# Patient Record
Sex: Male | Born: 2000 | Hispanic: Yes | Marital: Single | Smoking: Never smoker
Health system: Southern US, Community
[De-identification: ages and names within clinical notes are randomized; demographics above are authoritative.]

## PROBLEM LIST (undated history)

## (undated) DIAGNOSIS — T7840XA Allergy, unspecified, initial encounter: Secondary | ICD-10-CM

## (undated) DIAGNOSIS — J45909 Unspecified asthma, uncomplicated: Secondary | ICD-10-CM

## (undated) HISTORY — DX: Unspecified asthma, uncomplicated: J45.909

## (undated) HISTORY — DX: Allergy, unspecified, initial encounter: T78.40XA

---
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
## 2021-03-02 ENCOUNTER — Encounter (INDEPENDENT_AMBULATORY_CARE_PROVIDER_SITE_OTHER): Payer: Self-pay | Admitting: Dietician

## 2021-09-06 ENCOUNTER — Encounter (INDEPENDENT_AMBULATORY_CARE_PROVIDER_SITE_OTHER): Payer: Self-pay | Admitting: Pediatric Gastroenterology

## 2021-09-06 ENCOUNTER — Other Ambulatory Visit: Payer: Self-pay

## 2021-09-06 ENCOUNTER — Ambulatory Visit (INDEPENDENT_AMBULATORY_CARE_PROVIDER_SITE_OTHER): Payer: Medicaid Other | Admitting: Pediatric Gastroenterology

## 2021-09-06 VITALS — BP 110/70 | HR 72 | Ht 63.78 in | Wt 202.0 lb

## 2021-09-06 DIAGNOSIS — Z6833 Body mass index (BMI) 33.0-33.9, adult: Secondary | ICD-10-CM

## 2021-09-06 DIAGNOSIS — K760 Fatty (change of) liver, not elsewhere classified: Secondary | ICD-10-CM

## 2021-09-06 DIAGNOSIS — E6609 Other obesity due to excess calories: Secondary | ICD-10-CM

## 2021-09-06 NOTE — Progress Notes (Signed)
Pediatric Gastroenterology Follow Up Visit   REFERRING PROVIDER:  Hayden Pedro, MD [facility] [place],  [place] [address]   ASSESSMENT:     I had the pleasure of seeing Chad Carpenter, 20 y.o. male (DOB: 05-19-2001) who I saw in follow up today for evaluation of elevation of aminotransferases. My impression is that the most likely cause of the elevation of aminotransferases is nonalcoholic fatty liver disease (NAFLD).  Other causes of chronic hepatitis are possible but less likely.    An ultrasound that was performed 03/02/18 showed "No acute findings. No focal hepatic abnormality. Normal echotexture throughout the liver." Even though he did not have increased echotexture of the liver, NAFLD is still the most likely diagnosis.  His weight is going up and down since I first met him. I advised him to avoid sugary or sweetened beverages and to minimize ethanol consumption. I will order a repeat CMP and an alpha-fetoprotein       PLAN:       CMP and alpha-fetoprotein Results will guide next steps See back in 6 months Thank you for allowing [place] to participate in the care of your patient      HISTORY OF PRESENT ILLNESS: Chad Carpenter is a 20 y.o. male (DOB: 01-08-2001) who is seen in consultation for evaluation of elevation of aminotransferases. History was obtained from his Chad Carpenter. He has regained weight. He does not have pruritus. He has not been jaundiced. He has good energy. 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 He consumes ethanol in social events.  Past history As you know, Chad Carpenter was found to have increased aminotransferases during a routine physical examination.  He had elevation of AST and ALT, both.  His AST was 103 international units/L and his ALT was 130 international units/L.  His albumin, total bilirubin and alkaline phosphatase were all within normal limits.  He had mildly elevated total cholesterol.  He had nonfasting triglycerides  elevated at 134 mg/dL.  He does not have a family history of liver disease.  He is overweight.  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  He is not on medications that are commonly associated with hepatitis.  He has not been exposed to sick contacts.  The family is originally from [place] but he has been in the 90s states that since he was 20 years of age.  He has not traveled back to [place] since then.  Of note, his serum glucose was 63 and his hemoglobin A1c was 5.1%.  PAST MEDICAL HISTORY: Past Medical History:  Diagnosis Date   Allergy    Asthma     There is no immunization history on file for this patient. PAST SURGICAL HISTORY: History reviewed. No pertinent surgical history. SOCIAL HISTORY: Social History   Socioeconomic History   Marital status: Single    Spouse name: Not on file   Number of children: Not on file   Years of education: Not on file   Highest education level: Not on file  Occupational History   Not on file  Tobacco Use   Smoking status: Never   Smokeless tobacco: Never  Substance and Sexual Activity   Alcohol use: Not on file   Drug use: Not on file   Sexual activity: Not on file  Other Topics Concern   Not on file  Social History Narrative   Lives with mom  Step-dad and two sisters   Going to school for [employer]   Social  Determinants of Health   Financial Resource Strain: Not on file  Food Insecurity: Not on file  Transportation Needs: Not on file  Physical Activity: Not on file  Stress: Not on file  Social Connections: Not on file   FAMILY HISTORY: family history is not on file.   REVIEW OF SYSTEMS:  The balance of 12 systems reviewed is negative except as noted in the HPI.  MEDICATIONS: Current Outpatient Medications  Medication Sig Dispense Refill   cetirizine (ZYRTEC) 10 MG tablet TK 1 T PO D  3   FLOVENT HFA 110 MCG/ACT inhaler INL 2 PUFFS PO BID  3   fluticasone (FLONASE) 50 MCG/ACT nasal spray SHAKE LQ AND U 2 SPRAYS IEN D      montelukast (SINGULAIR) 10 MG tablet Take 10 mg by mouth daily.     No current facility-administered medications for this visit.   ALLERGIES: Amoxicillin  VITAL SIGNS: BP 110/70 (BP Location: Right Arm, Patient Position: Sitting)   Pulse 72   Ht 5' 3.78" (1.62 m)   Wt 202 lb (91.6 kg)   BMI 34.91 kg/m  PHYSICAL EXAM: Constitutional: Alert, no acute distress, obese, and well hydrated.  Mental Status: Pleasantly interactive, not anxious appearing. HEENT: PERRL, conjunctiva clear, anicteric, oropharynx clear, neck supple, no LAD. Respiratory: Clear to auscultation, unlabored breathing. Cardiac: Euvolemic, regular rate and rhythm, normal S1 and S2, no murmur. Abdomen: Soft, normal bowel sounds, non-distended, non-tender, no organomegaly or masses. Perianal/Rectal Exam: Not examined Extremities: No edema, well perfused. Musculoskeletal: No joint swelling or tenderness noted, no deformities. Skin: No rashes, jaundice or skin lesions noted. Neuro: No focal deficits.   DIAGNOSTIC STUDIES:  I have reviewed all pertinent diagnostic studies, including: No results found for this or any previous visit (from the past 2160 hour(s)).    Chad Carpenter A. Chad Savannah, MD Chief, Division of Pediatric Gastroenterology Professor of Pediatrics

## 2021-09-07 LAB — AFP TUMOR MARKER: AFP-Tumor Marker: 1.4 ng/mL (ref <6.1)

## 2021-09-07 LAB — COMPLETE METABOLIC PANEL WITH GFR
AG Ratio: 1.8 (calc) (ref 1.0–2.5)
ALT: 149 U/L — ABNORMAL HIGH (ref 8–46)
AST: 65 U/L — ABNORMAL HIGH (ref 12–32)
Albumin: 4.8 g/dL (ref 3.6–5.1)
Alkaline phosphatase (APISO): 141 U/L (ref 46–169)
BUN: 9 mg/dL (ref 7–20)
CO2: 28 mmol/L (ref 20–32)
Calcium: 10.2 mg/dL (ref 8.9–10.4)
Chloride: 101 mmol/L (ref 98–110)
Creat: 0.72 mg/dL (ref 0.60–1.24)
Globulin: 2.6 g/dL (calc) (ref 2.1–3.5)
Glucose, Bld: 82 mg/dL (ref 65–99)
Potassium: 4.1 mmol/L (ref 3.8–5.1)
Sodium: 139 mmol/L (ref 135–146)
Total Bilirubin: 0.8 mg/dL (ref 0.2–1.1)
Total Protein: 7.4 g/dL (ref 6.3–8.2)
eGFR: 136 mL/min/{1.73_m2} (ref >=60)

## 2022-03-07 ENCOUNTER — Ambulatory Visit (INDEPENDENT_AMBULATORY_CARE_PROVIDER_SITE_OTHER): Payer: Medicaid Other | Admitting: Pediatric Gastroenterology

## 2022-03-07 NOTE — Progress Notes (Deleted)
? ?Pediatric Gastroenterology Follow Up Visit ? ? ?REFERRING PROVIDER:  Hayden Pedro, MD ?[facility] ?[place],  [place] [address] ? ? ASSESSMENT:     ?I had the pleasure of seeing Chad Carpenter, 21 y.o. male (DOB: 2001-02-12) who I saw in follow up today for evaluation of elevation of aminotransferases. My impression is that the most likely cause of the elevation of aminotransferases is nonalcoholic fatty liver disease (NAFLD).  Other causes of chronic hepatitis are possible but less likely.   ? ?An ultrasound that was performed 03/02/18 showed "No acute findings. No focal hepatic abnormality. Normal echotexture throughout the liver." Even though he did not have increased echotexture of the liver, NAFLD is still the most likely diagnosis. ? ?His weight is going up and down since I first met him. I advised him to avoid sugary or sweetened beverages and to minimize ethanol consumption. In October 2022 his ALT had increased (see below) and an alpha-fetoprotein was normal. ? ? ?  ?  ?PLAN:       ?CMP ?Results will guide next steps ?See back in 6 months ?Thank you for allowing [place] to participate in the care of your patient ?  ? ?  ?HISTORY OF PRESENT ILLNESS: Chad Carpenter is a 21 y.o. male (DOB: June 23, 2001) who is seen in consultation for evaluation of elevation of aminotransferases. History was obtained from his Chad Carpenter. He has regained weight. He does not have pruritus. He has not been jaundiced. He has good energy. 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 ? ?Past history ?As you know, Chad Carpenter was found to have increased aminotransferases during a routine physical examination.  He had elevation of AST and ALT, both.  His AST was 103 international units/L and his ALT was 130 international units/L.  His albumin, total bilirubin and alkaline phosphatase were all within normal limits.  He had mildly elevated total cholesterol.  He had nonfasting  triglycerides elevated at 134 mg/dL.  He does not have a family history of liver disease.  He is overweight.  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  He is not on medications that are commonly associated with hepatitis.  He has not been exposed to sick contacts.  The family is originally from [place] but he has been in the 90s states that since he was 21 years of age.  He has not traveled back to [place] since then.  Of note, his serum glucose was 63 and his hemoglobin A1c was 5.1%. ? ?PAST MEDICAL HISTORY: ?Past Medical History:  ?Diagnosis Date  ? Allergy   ? Asthma   ? ? ?There is no immunization history on file for this patient. ?PAST SURGICAL HISTORY: ?No past surgical history on file. ?SOCIAL HISTORY: ?Social History  ? ?Socioeconomic History  ? Marital status: Single  ?  Spouse name: Not on file  ? Number of children: Not on file  ? Years of education: Not on file  ? Highest education level: Not on file  ?Occupational History  ? Not on file  ?Tobacco Use  ? Smoking status: Never  ? Smokeless tobacco: Never  ?Substance and Sexual Activity  ? Alcohol use: Not on file  ? Drug use: Not on file  ? Sexual activity: Not on file  ?Other Topics Concern  ? Not on file  ?Social History Narrative  ? Lives with mom  Step-dad and two sisters  ? Going to school for [employer]  ? ?  Social Determinants of Health  ? ?Financial Resource Strain: Not on file  ?Food Insecurity: Not on file  ?Transportation Needs: Not on file  ?Physical Activity: Not on file  ?Stress: Not on file  ?Social Connections: Not on file  ? ?FAMILY HISTORY: ?family history is not on file. ?  ?REVIEW OF SYSTEMS:  ?The balance of 12 systems reviewed is negative except as noted in the HPI.  ?MEDICATIONS: ?Current Outpatient Medications  ?Medication Sig Dispense Refill  ? cetirizine (ZYRTEC) 10 MG tablet TK 1 T PO D  3  ? FLOVENT HFA 110 MCG/ACT inhaler INL 2 PUFFS PO BID  3  ? fluticasone (FLONASE) 50 MCG/ACT nasal spray SHAKE LQ AND U 2 SPRAYS IEN D     ? montelukast (SINGULAIR) 10 MG tablet Take 10 mg by mouth daily.    ? ?No current facility-administered medications for this visit.  ? ?ALLERGIES: ?Amoxicillin ? VITAL SIGNS: ?There were no vitals taken for this visit. ?PHYSICAL EXAM: ?Constitutional: Alert, no acute distress, obese, and well hydrated.  ?Mental Status: Pleasantly interactive, not anxious appearing. ?HEENT: PERRL, conjunctiva clear, anicteric, oropharynx clear, neck supple, no LAD. ?Respiratory: Clear to auscultation, unlabored breathing. ?Cardiac: Euvolemic, regular rate and rhythm, normal S1 and S2, no murmur. ?Abdomen: Soft, normal bowel sounds, non-distended, non-tender, no organomegaly or masses. ?Perianal/Rectal Exam: Not examined ?Extremities: No edema, well perfused. ?Musculoskeletal: No joint swelling or tenderness noted, no deformities. ?Skin: No rashes, jaundice or skin lesions noted. ?Neuro: No focal deficits.  ? ?DIAGNOSTIC STUDIES:  I have reviewed all pertinent diagnostic studies, including: ?No results found for this or any previous visit (from the past 2160 hour(s)). ? ? ? ?Chad Mervine A. Yehuda Savannah, MD ?Chief, Division of Pediatric Gastroenterology ?Professor of Pediatrics ?

## 2024-08-21 ENCOUNTER — Encounter (INDEPENDENT_AMBULATORY_CARE_PROVIDER_SITE_OTHER): Payer: Self-pay

## 2024-08-22 ENCOUNTER — Encounter (INDEPENDENT_AMBULATORY_CARE_PROVIDER_SITE_OTHER): Payer: Self-pay
